# Patient Record
Sex: Male | Born: 1995 | Race: Black or African American | Hispanic: No | Marital: Single | Smoking: Current some day smoker
Health system: Southern US, Community
[De-identification: ages and names within clinical notes are randomized; demographics above are authoritative.]

## PROBLEM LIST (undated history)

## (undated) DIAGNOSIS — J45909 Unspecified asthma, uncomplicated: Secondary | ICD-10-CM

---
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
## 2017-08-31 ENCOUNTER — Emergency Department (HOSPITAL_COMMUNITY)
Admission: EM | Admit: 2017-08-31 | Discharge: 2017-08-31 | Disposition: A | Discharge status: Home / Self Care | Payer: Managed Care, Other (non HMO) | Attending: Emergency Medicine | Admitting: Emergency Medicine

## 2017-08-31 ENCOUNTER — Encounter (HOSPITAL_COMMUNITY): Payer: Self-pay | Admitting: Emergency Medicine

## 2017-08-31 ENCOUNTER — Other Ambulatory Visit: Payer: Self-pay

## 2017-08-31 ENCOUNTER — Emergency Department (HOSPITAL_COMMUNITY): Payer: Managed Care, Other (non HMO)

## 2017-08-31 DIAGNOSIS — J4541 Moderate persistent asthma with (acute) exacerbation: Secondary | ICD-10-CM | POA: Insufficient documentation

## 2017-08-31 DIAGNOSIS — R0602 Shortness of breath: Secondary | ICD-10-CM | POA: Diagnosis present

## 2017-08-31 HISTORY — DX: Unspecified asthma, uncomplicated: J45.909

## 2017-08-31 MED ORDER — DIPHENHYDRAMINE HCL 12.5 MG/5ML PO ELIX
12.5000 mg | ORAL_SOLUTION | Freq: Once | ORAL | Status: AC
  Administered 2017-08-31: 12.5 mg via ORAL
  Filled 2017-08-31: qty 5

## 2017-08-31 MED ORDER — ALBUTEROL SULFATE HFA 108 (90 BASE) MCG/ACT IN AERS
2.0000 | INHALATION_SPRAY | Freq: Once | RESPIRATORY_TRACT | Status: DC
  Filled 2017-08-31: qty 6.7

## 2017-08-31 MED ORDER — PREDNISONE 20 MG PO TABS
40.0000 mg | ORAL_TABLET | Freq: Once | ORAL | Status: AC
  Administered 2017-08-31: 40 mg via ORAL
  Filled 2017-08-31: qty 2

## 2017-08-31 MED ORDER — ALBUTEROL SULFATE (2.5 MG/3ML) 0.083% IN NEBU
2.5000 mg | INHALATION_SOLUTION | Freq: Once | RESPIRATORY_TRACT | Status: AC
  Administered 2017-08-31: 2.5 mg via RESPIRATORY_TRACT
  Filled 2017-08-31: qty 3

## 2017-08-31 MED ORDER — IBUPROFEN 800 MG PO TABS
800.0000 mg | ORAL_TABLET | Freq: Once | ORAL | Status: AC
  Administered 2017-08-31: 800 mg via ORAL
  Filled 2017-08-31: qty 1

## 2017-08-31 MED ORDER — IPRATROPIUM-ALBUTEROL 0.5-2.5 (3) MG/3ML IN SOLN
3.0000 mL | Freq: Once | RESPIRATORY_TRACT | Status: AC
  Administered 2017-08-31: 3 mL via RESPIRATORY_TRACT
  Filled 2017-08-31: qty 3

## 2017-08-31 MED ORDER — ONDANSETRON HCL 4 MG PO TABS
4.0000 mg | ORAL_TABLET | Freq: Once | ORAL | Status: AC
  Administered 2017-08-31: 4 mg via ORAL
  Filled 2017-08-31: qty 1

## 2017-08-31 MED ORDER — DEXAMETHASONE 4 MG PO TABS: 4.0000 mg | ORAL_TABLET | Freq: Two times a day (BID) | ORAL | 0 refills | Status: AC

## 2017-08-31 MED ORDER — DEXAMETHASONE 4 MG PO TABS: 4.0000 mg | ORAL_TABLET | Freq: Two times a day (BID) | ORAL | 0 refills | Status: DC

## 2017-08-31 MED ORDER — ALBUTEROL SULFATE (2.5 MG/3ML) 0.083% IN NEBU: 2.5000 mg | INHALATION_SOLUTION | RESPIRATORY_TRACT | 0 refills | Status: AC | PRN

## 2017-08-31 NOTE — ED Provider Notes (Signed)
[facility] EMERGENCY DEPARTMENT Provider Note   CSN: [id number] Arrival date & time: 08/31/17  1402     History   Chief Complaint Chief Complaint  Patient presents with  . Asthma    HPI Oscar Cobb is a 22 y.o. male.  Patient is a 22 year old male who presents to the emergency department with a complaint of asthma attack.  The patient states he has a history of asthma.  He states that he was awakened this morning with probable shortness of breath and congestion.  He does not recall any temperature elevations over the last few days.  He is not had any injury to the chest.  He has had congestion noted.  He has a cough that is mostly nonproductive.  No hemoptysis reported.  He has been trying his inhaler and nebulizer treatments at home, but he states these do not feel as though they have been successful.  He presents now for assistance with this issue.   The history is provided by the patient.    Past Medical History:  Diagnosis Date  . Asthma     There are no active problems to display for this patient.   No past surgical history on file.     Home Medications    Prior to Admission medications   Medication Sig Start Date End Date Taking? Authorizing Provider  albuterol (PROVENTIL HFA;VENTOLIN HFA) 108 (90 Base) MCG/ACT inhaler Inhale 1-2 puffs into the lungs every 6 (six) hours as needed for wheezing or shortness of breath.   Yes [provider]  albuterol (PROVENTIL) (2.5 MG/3ML) 0.083% nebulizer solution Take 2.5 mg by nebulization every 6 (six) hours as needed for wheezing or shortness of breath.   Yes [provider]    Family History History reviewed. No pertinent family history.  Social History Social History   Tobacco Use  . Smoking status: Never Smoker  . Smokeless tobacco: Never Used  Substance Use Topics  . Alcohol use: No  . Drug use: Not on file     Allergies   Patient has no known allergies.   Review of  Systems Review of Systems  Constitutional: Negative for activity change.       All ROS Neg except as noted in HPI  HENT: Positive for congestion. Negative for nosebleeds.   Eyes: Negative for photophobia and discharge.  Respiratory: Positive for shortness of breath and wheezing. Negative for cough.   Cardiovascular: Negative for chest pain and palpitations.  Gastrointestinal: Negative for abdominal pain and blood in stool.  Genitourinary: Negative for dysuria, frequency and hematuria.  Musculoskeletal: Negative for arthralgias, back pain and neck pain.  Skin: Negative.   Neurological: Negative for dizziness, seizures and speech difficulty.  Psychiatric/Behavioral: Negative for confusion and hallucinations.     Physical Exam Updated Vital Signs BP (!) 143/61 (BP Location: Left Arm)   Pulse (!) 107   Temp 98.6 F (37 C) (Oral)   Resp (!) 26   Ht 5\' 10"  (1.778 m)   Wt 117.9 kg (260 lb)   SpO2 99%   BMI 37.31 kg/m   Physical Exam  Constitutional: He is oriented to person, place, and time. He appears well-developed and well-nourished.  Non-toxic appearance.  HENT:  Head: Normocephalic.  Right Ear: Tympanic membrane and external ear normal.  Left Ear: Tympanic membrane and external ear normal.  Nasal congestion present.  Eyes: EOM and lids are normal. Pupils are equal, round, and reactive to light.  Neck: Normal range of motion. Neck  supple. Carotid bruit is not present.  Cardiovascular: Normal rate, regular rhythm, normal heart sounds, intact distal pulses and normal pulses.  Pulmonary/Chest: No respiratory distress. He has wheezes.  Abdominal: Soft. Bowel sounds are normal. There is no tenderness. There is no guarding.  Musculoskeletal: Normal range of motion.  Lymphadenopathy:       Head (right side): No submandibular adenopathy present.       Head (left side): No submandibular adenopathy present.    He has no cervical adenopathy.  Neurological: He is alert and oriented to  person, place, and time. He has normal strength. No cranial nerve deficit or sensory deficit.  Skin: Skin is warm and dry.  Psychiatric: He has a normal mood and affect. His speech is normal.  Nursing note and vitals reviewed.    ED Treatments / Results  Labs (all labs ordered are listed, but only abnormal results are displayed) Labs Reviewed - No data to display  EKG  EKG Interpretation None       Radiology Dg Chest 2 View  Result Date: 08/31/2017 CLINICAL DATA:  Shortness of breath for 1 week. Cough beginning this morning. History of asthma. EXAM: CHEST - 2 VIEW COMPARISON:  None. FINDINGS: The heart size and mediastinal contours are within normal limits. Both lungs are clear. No pleural effusion or pneumothorax. The visualized skeletal structures are unremarkable. IMPRESSION: Normal chest radiographs. Electronically Signed   By: Amie Portland M.D.   On: 08/31/2017 14:45    Procedures Procedures (including critical care time)  Medications Ordered in ED Medications  ipratropium-albuterol (DUONEB) 0.5-2.5 (3) MG/3ML nebulizer solution 3 mL (3 mLs Nebulization Given 08/31/17 1457)  albuterol (PROVENTIL) (2.5 MG/3ML) 0.083% nebulizer solution 2.5 mg (2.5 mg Nebulization Given 08/31/17 1457)     Initial Impression / Assessment and Plan / ED Course  I have reviewed the triage vital signs and the nursing notes.  Pertinent labs & imaging results that were available during my care of the patient were reviewed by me and considered in my medical decision making (see chart for details).       Final Clinical Impressions(s) / ED Diagnoses Pulse rate elevated at 107, respiratory rate 26 and blood pressure elevated at 143/61 upon admission to the emergency department.  Wheezing improving after albuterol Chest x-ray shows a normal chest radiograph.  Pt feeling better after ibuprofen and  Benadryl and prednisone.  Recheck.  Patient states he feels much better.  I discussed the  findings of the examination as well as the findings of the x-ray with the patient in terms which she understands.  Prescription for albuterol inhaler and albuterol solution given to the patient.  The patient is given a short course of steroid medication.  Patient is referred to the [facility] to establish a primary physician.  Patient will return to the emergency department if any problems with difficulty breathing, changes in his condition, or other problems.   Final diagnoses:  Moderate persistent asthma with exacerbation    ED Discharge Orders        Ordered    dexamethasone (DECADRON) 4 MG tablet  2 times daily with meals,   Status:  Discontinued     08/31/17 1629    albuterol (PROVENTIL) (2.5 MG/3ML) 0.083% nebulizer solution  Every 4 hours PRN     08/31/17 1648    dexamethasone (DECADRON) 4 MG tablet  2 times daily with meals     08/31/17 1659       Ivery Quale, PA-C  08/31/17 1718    Samuel JesterMcManus, Kathleen, DO 09/01/17 2216

## 2017-08-31 NOTE — Discharge Instructions (Signed)
Please see [facility] to establish a primary MD. Use decadron 2 times daily. Use albuterol every 4 hours for wheezing or difficulty breathing. Use tylenol every 4 hours for fever or aching. Please increase fluids. Use claritin D for congestion and to help with cough.

## 2017-08-31 NOTE — ED Triage Notes (Signed)
Patient c/o wheezing and shortness of breath due to asthma. Per patient woke this morning very short of breath. Per patient using inhaler and nebulizer treatments at home with no relief. Per patient nonproductive cough. Patient used inhaler 30 minutes prior to arrival.

## 2017-12-30 ENCOUNTER — Encounter: Payer: Self-pay | Admitting: Emergency Medicine

## 2017-12-30 ENCOUNTER — Other Ambulatory Visit: Payer: Self-pay

## 2017-12-30 ENCOUNTER — Emergency Department: Payer: Managed Care, Other (non HMO)

## 2017-12-30 ENCOUNTER — Emergency Department
Admission: EM | Admit: 2017-12-30 | Discharge: 2017-12-30 | Disposition: A | Discharge status: Home / Self Care | Payer: Managed Care, Other (non HMO) | Attending: Emergency Medicine | Admitting: Emergency Medicine

## 2017-12-30 DIAGNOSIS — R42 Dizziness and giddiness: Secondary | ICD-10-CM | POA: Diagnosis present

## 2017-12-30 DIAGNOSIS — J45909 Unspecified asthma, uncomplicated: Secondary | ICD-10-CM | POA: Insufficient documentation

## 2017-12-30 DIAGNOSIS — R55 Syncope and collapse: Secondary | ICD-10-CM | POA: Diagnosis not present

## 2017-12-30 DIAGNOSIS — Z79899 Other long term (current) drug therapy: Secondary | ICD-10-CM | POA: Diagnosis not present

## 2017-12-30 DIAGNOSIS — F17290 Nicotine dependence, other tobacco product, uncomplicated: Secondary | ICD-10-CM | POA: Diagnosis not present

## 2017-12-30 LAB — CBC
HCT: 43.3 % (ref 40.0–52.0)
HEMOGLOBIN: 14.8 g/dL (ref 13.0–18.0)
MCH: 30.2 pg (ref 26.0–34.0)
MCHC: 34.3 g/dL (ref 32.0–36.0)
MCV: 88.3 fL (ref 80.0–100.0)
Platelets: 217 10*3/uL (ref 150–440)
RBC: 4.91 MIL/uL (ref 4.40–5.90)
RDW: 13.4 % (ref 11.5–14.5)
WBC: 7.3 10*3/uL (ref 3.8–10.6)

## 2017-12-30 LAB — BASIC METABOLIC PANEL
ANION GAP: 6 (ref 5–15)
BUN: 13 mg/dL (ref 6–20)
CALCIUM: 9 mg/dL (ref 8.9–10.3)
CO2: 26 mmol/L (ref 22–32)
Chloride: 107 mmol/L (ref 98–111)
Creatinine, Ser: 0.89 mg/dL (ref 0.61–1.24)
GFR calc non Af Amer: >60 mL/min (ref >60)
GLUCOSE: 97 mg/dL (ref 70–99)
Potassium: 3.8 mmol/L (ref 3.5–5.1)
SODIUM: 139 mmol/L (ref 135–145)

## 2017-12-30 LAB — TROPONIN I

## 2017-12-30 MED ORDER — SODIUM CHLORIDE 0.9 % IV SOLN
1000.0000 mL | Freq: Once | INTRAVENOUS | Status: AC
  Administered 2017-12-30: 1000 mL via INTRAVENOUS

## 2017-12-30 NOTE — Discharge Instructions (Addendum)
Your workup in the ED was normal. Please follow up for further evaluation. Return to the ED if you symptoms return or if you develop new symptoms

## 2017-12-30 NOTE — ED Provider Notes (Signed)
[facility] Emergency Department Provider Note   ____________________________________________    I have reviewed the triage vital signs and the nursing notes.   HISTORY  Chief Complaint Chest Pain     HPI Oscar Cobb is a 22 y.o. male who presents after an episode of dizziness with mild chest tightness at the time.  Patient reports he was at work and felt lightheaded.  He had to sit down and felt that his chest became tight very briefly.  Currently feels quite well and has no complaints.  No nausea or vomiting or diaphoresis.  No history of heart disease.  Does have a history of asthma.  Reports his breathing is quite normal.  No pleurisy.  No fevers chills or cough.  Past Medical History:  Diagnosis Date  . Asthma     There are no active problems to display for this patient.   History reviewed. No pertinent surgical history.  Prior to Admission medications   Medication Sig Start Date End Date Taking? Authorizing Provider  albuterol (PROVENTIL) (2.5 MG/3ML) 0.083% nebulizer solution Take 3 mLs (2.5 mg total) by nebulization every 4 (four) hours as needed for wheezing or shortness of breath. 08/31/17   Ivery QualeBryant, Hobson, PA-C  dexamethasone (DECADRON) 4 MG tablet Take 1 tablet (4 mg total) by mouth 2 (two) times daily with a meal. 08/31/17   Ivery QualeBryant, Hobson, PA-C     Allergies Patient has no known allergies.  History reviewed. No pertinent family history.  Social History Social History   Tobacco Use  . Smoking status: Current Every Day Smoker    Types: Cigars  . Smokeless tobacco: Never Used  Substance Use Topics  . Alcohol use: No  . Drug use: Not on file    Review of Systems  Constitutional: No fever/chills Eyes: No visual changes.  ENT: No sore throat. Cardiovascular: As above Respiratory: Denies shortness of breath. Gastrointestinal: No abdominal pain.  No nausea, no vomiting.   Genitourinary: Negative for  dysuria. Musculoskeletal: Negative for back pain. Skin: Negative for rash. Neurological: Negative for headaches   ____________________________________________   PHYSICAL EXAM:  VITAL SIGNS: ED Triage Vitals  Enc Vitals Group     BP 12/30/17 0920 128/73     Pulse Rate 12/30/17 0920 70     Resp 12/30/17 0920 15     Temp 12/30/17 0920 98.5 F (36.9 C)     Temp Source 12/30/17 0920 Oral     SpO2 12/30/17 0918 98 %     Weight 12/30/17 0920 115.7 kg (255 lb)     Height 12/30/17 0920 1.778 m (5\' 10" )     Head Circumference --      Peak Flow --      Pain Score 12/30/17 0920 3     Pain Loc --      Pain Edu? --      Excl. in GC? --     Constitutional: Alert and oriented. No acute distress. Pleasant and interactive Eyes: Conjunctivae are normal.   Nose: No congestion/rhinnorhea. Mouth/Throat: Mucous membranes are moist.    Cardiovascular: Normal rate, regular rhythm. Grossly normal heart sounds.  Good peripheral circulation. Respiratory: Normal respiratory effort.  No retractions. Gastrointestinal: Soft and nontender. No distention.  No CVA tenderness.  Musculoskeletal: No lower extremity tenderness nor edema.  Warm and well perfused Neurologic:  Normal speech and language. No gross focal neurologic deficits are appreciated.  Skin:  Skin is warm, dry and intact. No rash noted. Psychiatric: Mood  and affect are normal. Speech and behavior are normal.  ____________________________________________   LABS (all labs ordered are listed, but only abnormal results are displayed)  Labs Reviewed  BASIC METABOLIC PANEL  CBC  TROPONIN I   ____________________________________________  EKG  ED ECG REPORT I, Jene Every, the attending physician, personally viewed and interpreted this ECG.  Date: 12/30/2017  Rhythm: normal sinus rhythm QRS Axis: normal Intervals: normal ST/T Wave abnormalities: normal Narrative Interpretation: no evidence of acute  ischemia  ____________________________________________  RADIOLOGY  Chest x-ray normal ____________________________________________   PROCEDURES  Procedure(s) performed: No  Procedures   Critical Care performed: No ____________________________________________   INITIAL IMPRESSION / ASSESSMENT AND PLAN / ED COURSE  Pertinent labs & imaging results that were available during my care of the patient were reviewed by me and considered in my medical decision making (see chart for details).  Patient well-appearing in no acute distress.  HPI most consistent with near syncopal episode, brief episode of chest tightness, now resolved.  EKG normal.  Troponin normal.  Lab work unremarkable.  Chest x-ray normal.  Reassuring exam.  Patient treated with IV fluids with no further dizziness.  Recommend discharge home with outpatient follow-up, rest, increase fluid intake, return precautions discussed   ____________________________________________   FINAL CLINICAL IMPRESSION(S) / ED DIAGNOSES  Final diagnoses:  Near syncope        Note:  This document was prepared using Dragon voice recognition software and may include unintentional dictation errors.    Jene Every, MD 12/30/17 [phone]

## 2017-12-30 NOTE — ED Triage Notes (Signed)
Pt arrived via EMS, pt was enroute to work when he started having mid chest pain that was intermittent, pt states when he walked the pain would start and we would feel jittery. Pt reports some dizziness as well, but denies SOB.

## 2018-01-01 ENCOUNTER — Other Ambulatory Visit: Payer: Self-pay

## 2018-01-01 ENCOUNTER — Emergency Department
Admission: EM | Admit: 2018-01-01 | Discharge: 2018-01-01 | Disposition: A | Discharge status: Home / Self Care | Payer: Managed Care, Other (non HMO) | Attending: Emergency Medicine | Admitting: Emergency Medicine

## 2018-01-01 ENCOUNTER — Encounter: Payer: Self-pay | Admitting: Emergency Medicine

## 2018-01-01 ENCOUNTER — Emergency Department: Payer: Managed Care, Other (non HMO)

## 2018-01-01 DIAGNOSIS — Z79899 Other long term (current) drug therapy: Secondary | ICD-10-CM | POA: Diagnosis not present

## 2018-01-01 DIAGNOSIS — K219 Gastro-esophageal reflux disease without esophagitis: Secondary | ICD-10-CM

## 2018-01-01 DIAGNOSIS — J45909 Unspecified asthma, uncomplicated: Secondary | ICD-10-CM | POA: Diagnosis not present

## 2018-01-01 DIAGNOSIS — R079 Chest pain, unspecified: Secondary | ICD-10-CM | POA: Diagnosis present

## 2018-01-01 DIAGNOSIS — F17210 Nicotine dependence, cigarettes, uncomplicated: Secondary | ICD-10-CM | POA: Insufficient documentation

## 2018-01-01 LAB — CBC
HCT: 45.9 % (ref 40.0–52.0)
Hemoglobin: 15.5 g/dL (ref 13.0–18.0)
MCH: 29.8 pg (ref 26.0–34.0)
MCHC: 33.7 g/dL (ref 32.0–36.0)
MCV: 88.6 fL (ref 80.0–100.0)
PLATELETS: 209 10*3/uL (ref 150–440)
RBC: 5.18 MIL/uL (ref 4.40–5.90)
RDW: 13.6 % (ref 11.5–14.5)
WBC: 7 10*3/uL (ref 3.8–10.6)

## 2018-01-01 LAB — BASIC METABOLIC PANEL
Anion gap: 9 (ref 5–15)
BUN: 14 mg/dL (ref 6–20)
CALCIUM: 9.4 mg/dL (ref 8.9–10.3)
CHLORIDE: 108 mmol/L (ref 98–111)
CO2: 24 mmol/L (ref 22–32)
CREATININE: 0.95 mg/dL (ref 0.61–1.24)
GFR calc non Af Amer: >60 mL/min (ref >60)
GLUCOSE: 97 mg/dL (ref 70–99)
Potassium: 3.9 mmol/L (ref 3.5–5.1)
Sodium: 141 mmol/L (ref 135–145)

## 2018-01-01 LAB — TROPONIN I

## 2018-01-01 MED ORDER — ALUMINUM-MAGNESIUM-SIMETHICONE 200-200-20 MG/5ML PO SUSP: 30.0000 mL | Freq: Three times a day (TID) | ORAL | 0 refills | Status: AC

## 2018-01-01 MED ORDER — FAMOTIDINE 20 MG PO TABS: 20.0000 mg | ORAL_TABLET | Freq: Two times a day (BID) | ORAL | 0 refills | Status: AC

## 2018-01-01 NOTE — ED Provider Notes (Signed)
[facility] Emergency Department Provider Note  ____________________________________________  Time seen: Approximately 10:37 AM  I have reviewed the triage vital signs and the nursing notes.   HISTORY  Chief Complaint Chest Pain    HPI Oscar Cobb is a 22 y.o. male with a past medical history of asthma who complains of chest pain in the lower mid chest for the past 3 days.  Feels like burning, intermittent lasting an hour at a time, no aggravating factors.  Alleviated by eating or drinking water.  Nonradiating.  No shortness of breath diaphoresis or vomiting.  Not exertional, not pleuritic.  Has not tried any medicines.      Past Medical History:  Diagnosis Date  . Asthma      There are no active problems to display for this patient.    History reviewed. No pertinent surgical history.   Prior to Admission medications   Medication Sig Start Date End Date Taking? Authorizing Provider  albuterol (PROVENTIL) (2.5 MG/3ML) 0.083% nebulizer solution Take 3 mLs (2.5 mg total) by nebulization every 4 (four) hours as needed for wheezing or shortness of breath. 08/31/17   Ivery Quale, PA-C  aluminum-magnesium hydroxide-simethicone (MAALOX) 200-200-20 MG/5ML SUSP Take 30 mLs by mouth 4 (four) times daily -  before meals and at bedtime. 01/01/18   Sharman Cheek, MD  dexamethasone (DECADRON) 4 MG tablet Take 1 tablet (4 mg total) by mouth 2 (two) times daily with a meal. 08/31/17   Ivery Quale, PA-C  famotidine (PEPCID) 20 MG tablet Take 1 tablet (20 mg total) by mouth 2 (two) times daily. 01/01/18   Sharman Cheek, MD     Allergies Patient has no known allergies.   History reviewed. No pertinent family history.  Social History Social History   Tobacco Use  . Smoking status: Current Every Day Smoker    Packs/day: 0.50    Types: Cigars, Cigarettes  . Smokeless tobacco: Never Used  Substance Use Topics  . Alcohol use: No  . Drug use:  Not on file    Review of Systems  Constitutional:   No fever or chills.  ENT:   No sore throat. No rhinorrhea. Cardiovascular:   Positive as above chest pain without syncope. Respiratory:   No dyspnea or cough. Gastrointestinal:   Negative for abdominal pain, vomiting and diarrhea.  Musculoskeletal:   Negative for focal pain or swelling All other systems reviewed and are negative except as documented above in ROS and HPI.  ____________________________________________   PHYSICAL EXAM:  VITAL SIGNS: ED Triage Vitals  Enc Vitals Group     BP 01/01/18 0927 (!) 136/95     Pulse Rate 01/01/18 0927 74     Resp 01/01/18 0927 18     Temp 01/01/18 0927 98.2 F (36.8 C)     Temp src --      SpO2 01/01/18 0927 99 %     Weight 01/01/18 0928 255 lb (115.7 kg)     Height 01/01/18 0928 5\' 10"  (1.778 m)     Head Circumference --      Peak Flow --      Pain Score 01/01/18 0927 5     Pain Loc --      Pain Edu? --      Excl. in GC? --     Vital signs reviewed, nursing assessments reviewed.   Constitutional:   Alert and oriented. Non-toxic appearance. Eyes:   Conjunctivae are normal. EOMI. PERRL. ENT      Head:  Normocephalic and atraumatic.      Nose:   No congestion/rhinnorhea.       Mouth/Throat:   MMM, no pharyngeal erythema. No peritonsillar mass.       Neck:   No meningismus. Full ROM. Hematological/Lymphatic/Immunilogical:   No cervical lymphadenopathy. Cardiovascular:   RRR. Symmetric bilateral radial and DP pulses.  No murmurs.  Respiratory:   Normal respiratory effort without tachypnea/retractions. Breath sounds are clear and equal bilaterally. No wheezes/rales/rhonchi. Gastrointestinal:   Soft and nontender. Non distended. There is no CVA tenderness.  No rebound, rigidity, or guarding. Musculoskeletal:   Normal range of motion in all extremities. No joint effusions.  No lower extremity tenderness.  No edema. Neurologic:   Normal speech and language.  Motor grossly  intact. No acute focal neurologic deficits are appreciated.  Skin:    Skin is warm, dry and intact. No rash noted.  No petechiae, purpura, or bullae.  ____________________________________________    LABS (pertinent positives/negatives) (all labs ordered are listed, but only abnormal results are displayed) Labs Reviewed  BASIC METABOLIC PANEL  CBC  TROPONIN I   ____________________________________________   EKG  Interpreted by me Normal sinus rhythm rate of 70, normal axis intervals QRS ST segments and T waves.  Benign early repolarization  ____________________________________________    RADIOLOGY  Dg Chest 2 View  Result Date: 01/01/2018 CLINICAL DATA:  Chest pain EXAM: CHEST - 2 VIEW COMPARISON:  December 30, 2017 FINDINGS: Lungs are clear. Heart size and pulmonary vascularity are normal. No adenopathy. No pneumothorax. No bone lesions. IMPRESSION: No edema or consolidation. Electronically Signed   By: Bretta BangWilliam  Woodruff III M.D.   On: 01/01/2018 09:53    ____________________________________________   PROCEDURES Procedures  ____________________________________________    CLINICAL IMPRESSION / ASSESSMENT AND PLAN / ED COURSE  Pertinent labs & imaging results that were available during my care of the patient were reviewed by me and considered in my medical decision making (see chart for details).    Patient presents with likely GERD.Considering the patient's symptoms, medical history, and physical examination today, I have low suspicion for ACS, PE, TAD, pneumothorax, carditis, mediastinitis, pneumonia, CHF, or sepsis.  Vital signs are normal.  Clinical Course as of Jan 02 1036  Thu Jan 01, 2018  1031 Chest x-ray EKG and labs all normal.  Symptoms highly suggestive of GERD.  Start on antacids, discharged home to follow-up with primary care.   [PS]    Clinical Course User Index [PS] Sharman CheekStafford, Adelita Hone, MD     ____________________________________________   FINAL  CLINICAL IMPRESSION(S) / ED DIAGNOSES    Final diagnoses:  Nonspecific chest pain  Gastroesophageal reflux disease without esophagitis     ED Discharge Orders        Ordered    famotidine (PEPCID) 20 MG tablet  2 times daily     01/01/18 1037    aluminum-magnesium hydroxide-simethicone (MAALOX) 200-200-20 MG/5ML SUSP  3 times daily before meals & bedtime     01/01/18 1037      Portions of this note were generated with dragon dictation software. Dictation errors may occur despite best attempts at proofreading.    Sharman CheekStafford, Adonijah Baena, MD 01/01/18 1039

## 2018-01-01 NOTE — ED Triage Notes (Signed)
Pt presents with continued chest pain since Tuesday, intermittently. States he was on his way to work when it came back today and that [facility]. States it feels like aching, with a slight burn. Denies eating or drinking anything today. NAD noted.

## 2018-07-25 ENCOUNTER — Other Ambulatory Visit: Payer: Self-pay

## 2018-07-25 ENCOUNTER — Encounter (HOSPITAL_COMMUNITY): Payer: Self-pay | Admitting: Emergency Medicine

## 2018-07-25 ENCOUNTER — Emergency Department (HOSPITAL_COMMUNITY)
Admission: EM | Admit: 2018-07-25 | Discharge: 2018-07-25 | Disposition: A | Discharge status: Home / Self Care | Payer: 59 | Attending: Emergency Medicine | Admitting: Emergency Medicine

## 2018-07-25 DIAGNOSIS — R21 Rash and other nonspecific skin eruption: Secondary | ICD-10-CM | POA: Diagnosis present

## 2018-07-25 DIAGNOSIS — L739 Follicular disorder, unspecified: Secondary | ICD-10-CM | POA: Diagnosis not present

## 2018-07-25 DIAGNOSIS — B356 Tinea cruris: Secondary | ICD-10-CM | POA: Diagnosis not present

## 2018-07-25 DIAGNOSIS — F17210 Nicotine dependence, cigarettes, uncomplicated: Secondary | ICD-10-CM | POA: Insufficient documentation

## 2018-07-25 DIAGNOSIS — Z79899 Other long term (current) drug therapy: Secondary | ICD-10-CM | POA: Diagnosis not present

## 2018-07-25 DIAGNOSIS — J45909 Unspecified asthma, uncomplicated: Secondary | ICD-10-CM | POA: Insufficient documentation

## 2018-07-25 MED ORDER — DOXYCYCLINE HYCLATE 100 MG PO TABS
100.0000 mg | ORAL_TABLET | Freq: Once | ORAL | Status: AC
  Administered 2018-07-25: 100 mg via ORAL
  Filled 2018-07-25: qty 1

## 2018-07-25 MED ORDER — CLOTRIMAZOLE 1 % EX CREA: 1.0000 "application " | TOPICAL_CREAM | Freq: Two times a day (BID) | CUTANEOUS | 0 refills | Status: AC

## 2018-07-25 NOTE — Discharge Instructions (Signed)
Return if any problems.

## 2018-07-25 NOTE — ED Triage Notes (Signed)
Pt c/o intermittent itchy bumps to pubic area. Pt states they are not on his penis or scrotum. Denies any abnormal discharge or GU symptoms. Denies recent unprotected intercourse.

## 2018-07-26 LAB — HIV ANTIBODY (ROUTINE TESTING W REFLEX): HIV Screen 4th Generation wRfx: NONREACTIVE

## 2018-07-26 LAB — RPR: RPR Ser Ql: NONREACTIVE

## 2018-07-26 NOTE — ED Provider Notes (Signed)
[facility] EMERGENCY DEPARTMENT Provider Note   CSN: [id number] Arrival date & time: 07/25/18  1013     History   Chief Complaint Chief Complaint  Patient presents with  . SEXUALLY TRANSMITTED DISEASE    HPI Oscar Cobb is a 23 y.o. male.  The history is provided by the patient. No language interpreter was used.  Rash  Location:  Pelvis Pelvic rash location:  Groin Quality: itchiness and redness   Severity:  Moderate Onset quality:  Gradual Timing:  Constant Progression:  Worsening Chronicity:  New Relieved by:  Nothing Worsened by:  Nothing Ineffective treatments:  None tried Associated symptoms: no fever   Pt request std testing   Past Medical History:  Diagnosis Date  . Asthma     There are no active problems to display for this patient.   History reviewed. No pertinent surgical history.      Home Medications    Prior to Admission medications   Medication Sig Start Date End Date Taking? Authorizing Provider  albuterol (PROVENTIL) (2.5 MG/3ML) 0.083% nebulizer solution Take 3 mLs (2.5 mg total) by nebulization every 4 (four) hours as needed for wheezing or shortness of breath. 08/31/17   Ivery Quale, PA-C  aluminum-magnesium hydroxide-simethicone (MAALOX) 200-200-20 MG/5ML SUSP Take 30 mLs by mouth 4 (four) times daily -  before meals and at bedtime. 01/01/18   Sharman Cheek, MD  clotrimazole (LOTRIMIN AF JOCK ITCH) 1 % cream Apply 1 application topically 2 (two) times daily. 07/25/18   Elson Areas, PA-C  dexamethasone (DECADRON) 4 MG tablet Take 1 tablet (4 mg total) by mouth 2 (two) times daily with a meal. 08/31/17   Ivery Quale, PA-C  famotidine (PEPCID) 20 MG tablet Take 1 tablet (20 mg total) by mouth 2 (two) times daily. 01/01/18   Sharman Cheek, MD    Family History No family history on file.  Social History Social History   Tobacco Use  . Smoking status: Current Some Day Smoker    Packs/day: 0.50    Types: Cigars,  Cigarettes  . Smokeless tobacco: Never Used  Substance Use Topics  . Alcohol use: No  . Drug use: Not on file     Allergies   Patient has no known allergies.   Review of Systems Review of Systems  Constitutional: Negative for fever.  Skin: Positive for rash.  All other systems reviewed and are negative.    Physical Exam Updated Vital Signs BP 125/75 (BP Location: Right Arm)   Pulse 82   Temp 98.3 F (36.8 C) (Oral)   Resp 16   Ht 5\' 11"  (1.803 m)   Wt 108.9 kg   SpO2 100%   BMI 33.47 kg/m   Physical Exam Vitals signs and nursing note reviewed.  Constitutional:      Appearance: He is well-developed.  HENT:     Head: Normocephalic.     Nose: Nose normal.     Mouth/Throat:     Mouth: Mucous membranes are moist.  Neck:     Musculoskeletal: Normal range of motion.  Cardiovascular:     Rate and Rhythm: Normal rate.  Pulmonary:     Effort: Pulmonary effort is normal.  Abdominal:     General: There is no distension.  Genitourinary:    Penis: Normal.      Comments: Raised dried bumps crease of groin and hairline  Musculoskeletal: Normal range of motion.  Skin:    General: Skin is warm.  Neurological:     Mental  Status: He is alert and oriented to person, place, and time.  Psychiatric:        Mood and Affect: Mood normal.      ED Treatments / Results  Labs (all labs ordered are listed, but only abnormal results are displayed) Labs Reviewed  RPR  HIV ANTIBODY (ROUTINE TESTING W REFLEX)  GC/CHLAMYDIA PROBE AMP ([place]) NOT AT [facility]    EKG None  Radiology No results found.  Procedures Procedures (including critical care time)  Medications Ordered in ED Medications  doxycycline (VIBRA-TABS) tablet 100 mg (100 mg Oral Given 07/25/18 1216)     Initial Impression / Assessment and Plan / ED Course  I have reviewed the triage vital signs and the nursing notes.  Pertinent labs & imaging results that were available during my care of the patient  were reviewed by me and considered in my medical decision making (see chart for details).     MDM  Rash looks fungal,  Pt may also have some folliculitis.  Labs for gc/ct/Rpr/Hiv  Pt given rx for doxycycline and lotrimin   Final Clinical Impressions(s) / ED Diagnoses   Final diagnoses:  Folliculitis  Jock itch    ED Discharge Orders         Ordered    clotrimazole (LOTRIMIN AF JOCK ITCH) 1 % cream  2 times daily     07/25/18 1204        An After Visit Summary was printed and given to the patient.    Elson Areas, [place] 07/26/18 7510    Eber Hong, MD 07/28/18 [phone]

## 2018-07-27 LAB — GC/CHLAMYDIA PROBE AMP (~~LOC~~) NOT AT ARMC
Chlamydia: NEGATIVE
Neisseria Gonorrhea: NEGATIVE

## 2019-04-21 ENCOUNTER — Ambulatory Visit: Payer: 59 | Admitting: Family Medicine

## 2019-05-05 IMAGING — CR DG CHEST 2V
1 series · 2 of 2 positions shown · non-contrast
Comparison: December 30, 2017

CLINICAL DATA: Chest pain

EXAM:
CHEST - 2 VIEW

[Series 1: dg chest 2 view · 0.14mm/px · 2 of 2 slices shown]
[im 1/2]
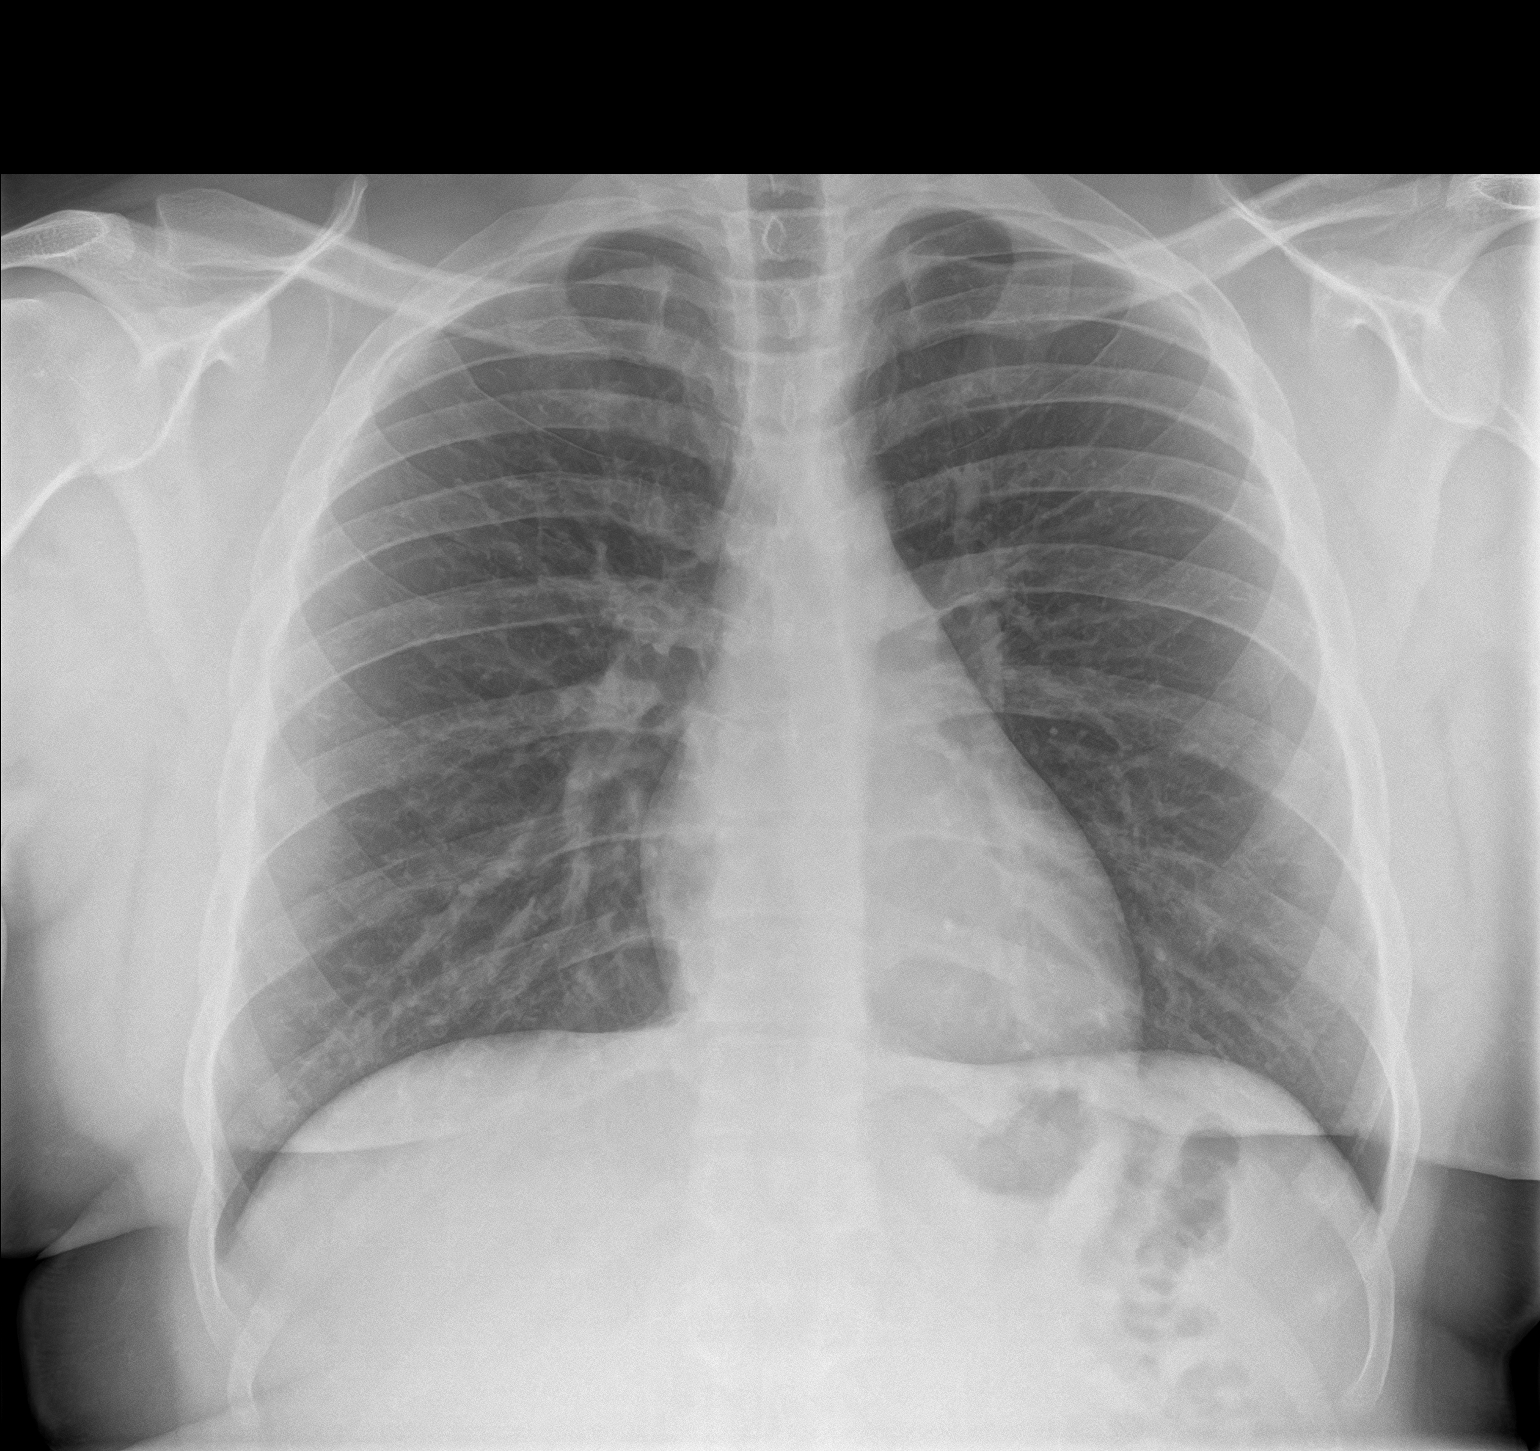
[im 2/2]
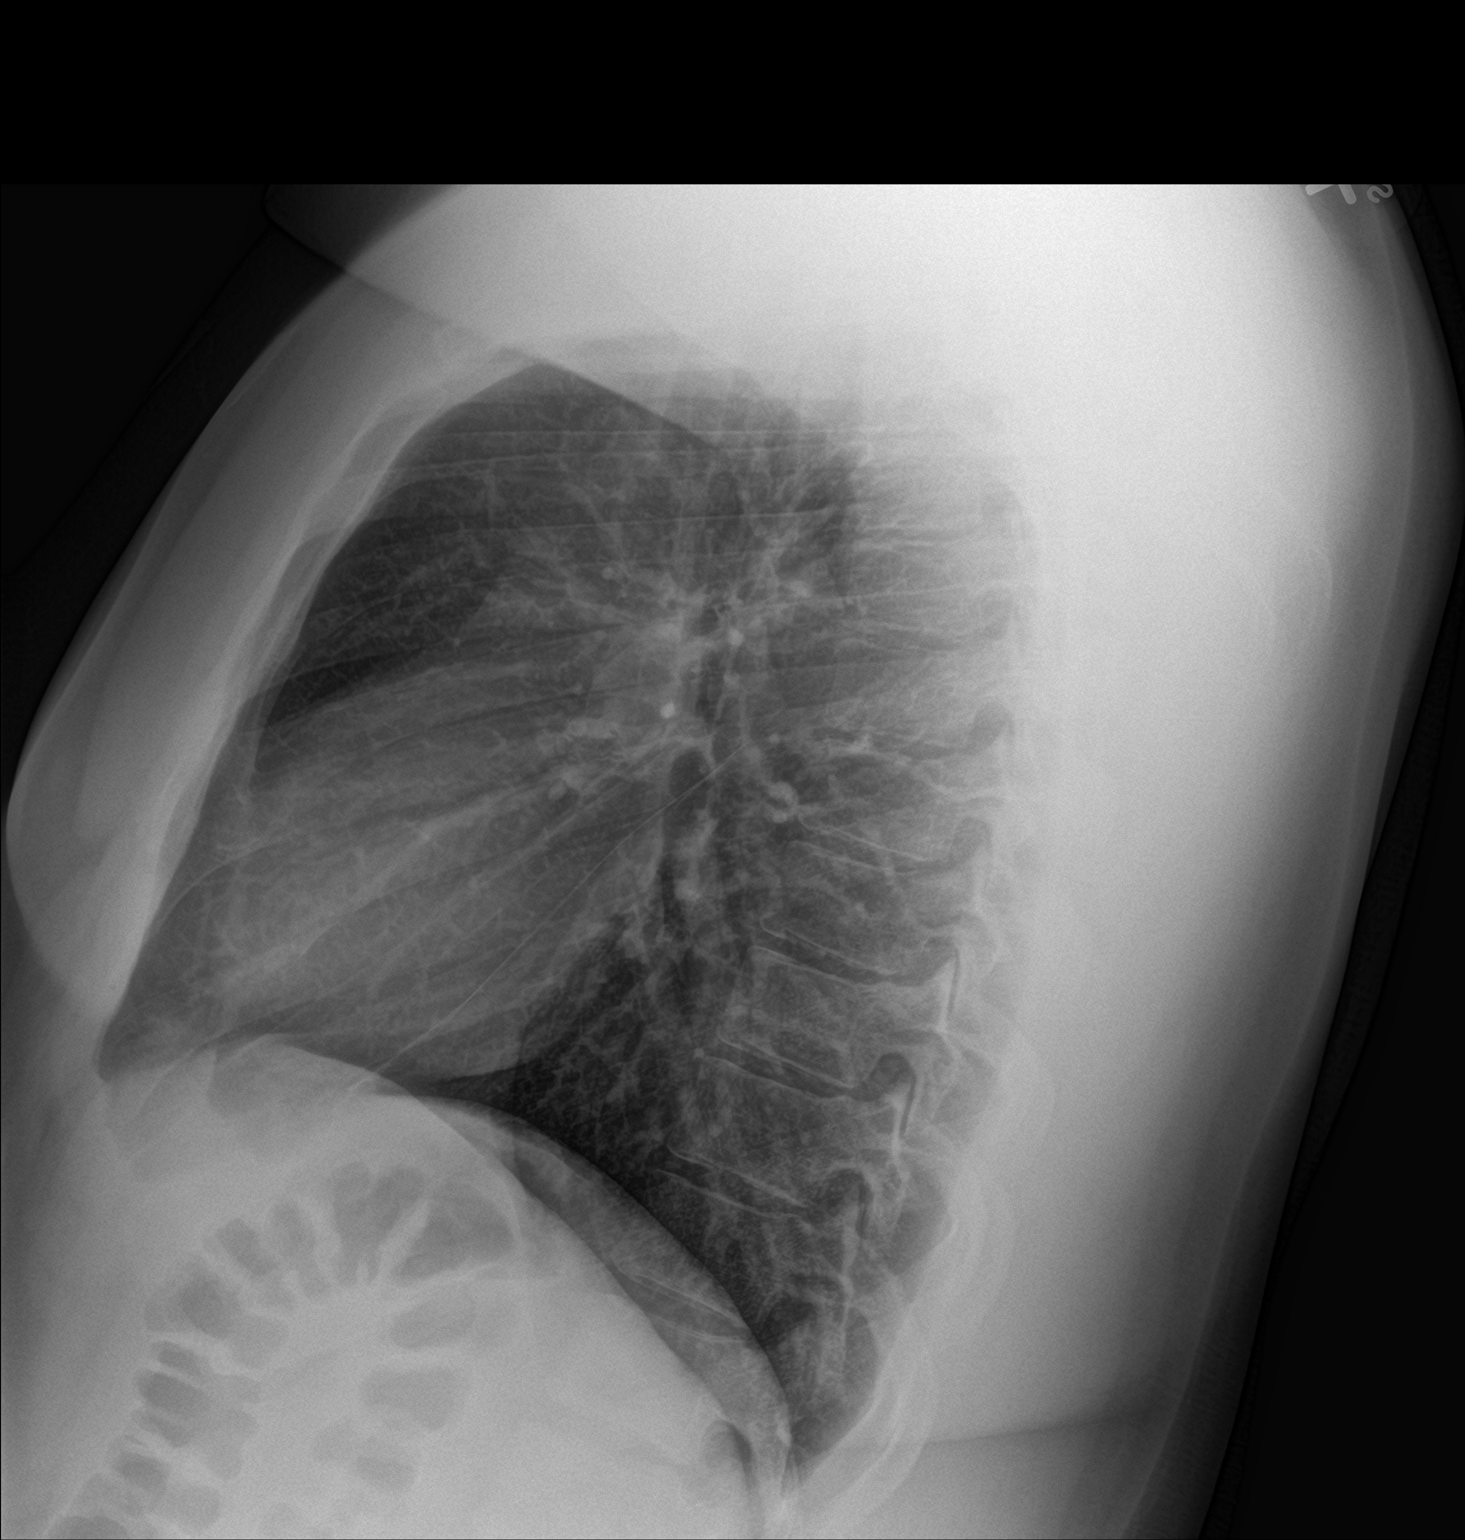

[2 of 2 positions shown; findings below may reference images not displayed]

FINDINGS: Lungs are clear. Heart size and pulmonary vascularity are normal. No
adenopathy. No pneumothorax. No bone lesions.
IMPRESSION: No edema or consolidation.
